# Patient Record
Sex: Male | Born: 1960 | Race: White | Hispanic: No | Marital: Single | Smoking: Never smoker
Health system: Southern US, Community
[De-identification: ages and names within clinical notes are randomized; demographics above are authoritative.]

## PROBLEM LIST (undated history)

## (undated) DIAGNOSIS — N179 Acute kidney failure, unspecified: Secondary | ICD-10-CM

## (undated) DIAGNOSIS — I82409 Acute embolism and thrombosis of unspecified deep veins of unspecified lower extremity: Secondary | ICD-10-CM

## (undated) DIAGNOSIS — E119 Type 2 diabetes mellitus without complications: Secondary | ICD-10-CM

## (undated) DIAGNOSIS — H3530 Unspecified macular degeneration: Secondary | ICD-10-CM

## (undated) HISTORY — PX: LEG AMPUTATION BELOW KNEE: SHX694

## (undated) HISTORY — PX: APPENDECTOMY: SHX54

---

## 2015-09-26 ENCOUNTER — Emergency Department (HOSPITAL_COMMUNITY)
Admission: EM | Admit: 2015-09-26 | Discharge: 2015-09-26 | Disposition: A | Discharge status: Home / Self Care | Payer: BLUE CROSS/BLUE SHIELD | Attending: Emergency Medicine | Admitting: Emergency Medicine

## 2015-09-26 ENCOUNTER — Emergency Department (HOSPITAL_COMMUNITY): Payer: BLUE CROSS/BLUE SHIELD

## 2015-09-26 ENCOUNTER — Encounter (HOSPITAL_COMMUNITY): Payer: Self-pay | Admitting: *Deleted

## 2015-09-26 DIAGNOSIS — W1839XA Other fall on same level, initial encounter: Secondary | ICD-10-CM | POA: Diagnosis not present

## 2015-09-26 DIAGNOSIS — Z79899 Other long term (current) drug therapy: Secondary | ICD-10-CM | POA: Diagnosis not present

## 2015-09-26 DIAGNOSIS — W19XXXA Unspecified fall, initial encounter: Secondary | ICD-10-CM

## 2015-09-26 DIAGNOSIS — Z8669 Personal history of other diseases of the nervous system and sense organs: Secondary | ICD-10-CM | POA: Insufficient documentation

## 2015-09-26 DIAGNOSIS — Z7901 Long term (current) use of anticoagulants: Secondary | ICD-10-CM | POA: Insufficient documentation

## 2015-09-26 DIAGNOSIS — Z043 Encounter for examination and observation following other accident: Secondary | ICD-10-CM | POA: Diagnosis present

## 2015-09-26 DIAGNOSIS — Z9889 Other specified postprocedural states: Secondary | ICD-10-CM | POA: Diagnosis not present

## 2015-09-26 DIAGNOSIS — Z86718 Personal history of other venous thrombosis and embolism: Secondary | ICD-10-CM | POA: Insufficient documentation

## 2015-09-26 DIAGNOSIS — Y9289 Other specified places as the place of occurrence of the external cause: Secondary | ICD-10-CM | POA: Insufficient documentation

## 2015-09-26 DIAGNOSIS — Z87448 Personal history of other diseases of urinary system: Secondary | ICD-10-CM | POA: Insufficient documentation

## 2015-09-26 DIAGNOSIS — E119 Type 2 diabetes mellitus without complications: Secondary | ICD-10-CM | POA: Diagnosis not present

## 2015-09-26 DIAGNOSIS — Y998 Other external cause status: Secondary | ICD-10-CM | POA: Diagnosis not present

## 2015-09-26 DIAGNOSIS — Y9389 Activity, other specified: Secondary | ICD-10-CM | POA: Insufficient documentation

## 2015-09-26 DIAGNOSIS — Z7982 Long term (current) use of aspirin: Secondary | ICD-10-CM | POA: Diagnosis not present

## 2015-09-26 HISTORY — DX: Unspecified macular degeneration: H35.30

## 2015-09-26 HISTORY — DX: Acute kidney failure, unspecified: N17.9

## 2015-09-26 HISTORY — DX: Acute embolism and thrombosis of unspecified deep veins of unspecified lower extremity: I82.409

## 2015-09-26 HISTORY — DX: Type 2 diabetes mellitus without complications: E11.9

## 2015-09-26 LAB — CBG MONITORING, ED: GLUCOSE-CAPILLARY: 110 mg/dL — AB (ref 65–99)

## 2015-09-26 MED ORDER — OXYCODONE-ACETAMINOPHEN 5-325 MG PO TABS
1.0000 | ORAL_TABLET | Freq: Once | ORAL | Status: AC
  Administered 2015-09-26: 1 via ORAL
  Filled 2015-09-26: qty 1

## 2015-09-26 NOTE — ED Notes (Signed)
Per EMS - patient [place] nearby restaurant with c/o left BKA wound dehiscence.  Patient was at a restaurant when he lost his balance and fell on amputation, which was done mid-September.  Patient's sutures were just removed yesterday.  Patient now presents with dehisced wound.  Patient's vitals WNL 127/83, HR 88, RR 20, CBG 270.

## 2015-09-26 NOTE — ED Notes (Signed)
Patient presents with left BKA and dehisced wound on stump.  Patient had amputation in September 2016 and sutures were removed yesterday.  Patient fell on stump earlier today and the wound dehisced.  Bleeding is controlled.

## 2015-09-26 NOTE — Discharge Instructions (Signed)
Call your surgeon tomorrow and see when they want to see you.  Change dressing daily

## 2015-09-26 NOTE — ED Notes (Signed)
Bed: [id number] Expected date:  Expected time:  Means of arrival:  Comments: [age] M fall, opened stitches back up from BKA

## 2015-09-26 NOTE — Progress Notes (Signed)
Patient listed as not having insurance or a pcp.  EDCM spoke to patient at bedside.  Patient reports he has [employer].  He reports that [facility] has assigned him a pcp, he has not met him yet.

## 2015-09-30 NOTE — ED Provider Notes (Signed)
CSN: [id number]     Arrival date & time 09/26/15  1531 History   First MD Initiated Contact with Patient 09/26/15 1533     Chief Complaint  Patient presents with  . Wound dehiscence      (Consider location/radiation/quality/duration/timing/severity/associated sxs/prior Treatment) Patient is a 54 y.o. male presenting with fall. The history is provided by the patient (Patient had a left BKA a month ago patient states his sutures were taken out yesterday. He fell today right on his left leg. The wound has opened up.).  Fall This is a new problem. The current episode started less than 1 hour ago. The problem occurs constantly. The problem has not changed since onset.Pertinent negatives include no chest pain, no abdominal pain and no headaches. Nothing aggravates the symptoms. Nothing relieves the symptoms.    Past Medical History  Diagnosis Date  . Diabetes mellitus without complication (HCC)   . Macular degeneration   . DVT (deep venous thrombosis) (HCC)   . Acute kidney injury [facility])    Past Surgical History  Procedure Laterality Date  . Leg amputation below knee Left   . Appendectomy     No family history on file. Social History  Substance Use Topics  . Smoking status: Never Smoker   . Smokeless tobacco: Never Used  . 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  HENT: Negative for congestion, ear discharge and sinus pressure.   Eyes: Negative for discharge.  Respiratory: Negative for cough.   Cardiovascular: Negative for chest pain.  Gastrointestinal: Negative for abdominal pain and diarrhea.  Genitourinary: Negative for frequency and hematuria.  Musculoskeletal: Negative for back pain.  Skin: Negative for rash.  Neurological: Negative for seizures and headaches.  Psychiatric/Behavioral: Negative for hallucinations.      Allergies  Review of patient's allergies  indicates no known allergies.  Home Medications   Prior to Admission medications   Medication Sig Start Date End Date Taking? Authorizing Provider  acetaminophen (TYLENOL) 325 MG tablet Take 975 mg by mouth every 6 (six) hours as needed for moderate pain.  09/24/15 10/24/15 Yes Historical Provider, MD  aspirin EC 81 MG tablet Take 81 mg by mouth daily.  09/24/15 10/24/15 Yes Historical Provider, MD  citalopram (CELEXA) 20 MG tablet Take 20 mg by mouth daily.  09/24/15  Yes Historical Provider, MD  glipiZIDE (GLUCOTROL XL) 2.5 MG 24 hr tablet Take 2.5 mg by mouth daily with breakfast.  09/24/15 10/24/15 Yes Historical Provider, MD  magnesium oxide (MAG-OX) 400 MG tablet Take 400 mg by mouth daily.  09/24/15 10/24/15 Yes Historical Provider, MD  oxyCODONE (OXY IR/ROXICODONE) 5 MG immediate release tablet Take 5 mg by mouth daily as needed for severe pain.  09/18/15  Yes Historical Provider, MD  polyvinyl alcohol (LIQUIFILM TEARS) 1.4 % ophthalmic solution Place 1 drop into both eyes daily as needed for dry eyes.  09/18/15 10/18/15 Yes Historical Provider, MD  warfarin (COUMADIN) 2 MG tablet Take 2 tablets (total ) daily at 6pm 09/24/15  Yes Historical Provider, MD  traMADol (ULTRAM) 50 MG tablet Take 50 mg by mouth every 6 (six) hours as needed for moderate pain or severe pain.  08/04/15   Historical Provider, MD   BP 146/87 mmHg  Pulse 93  Temp(Src) 99 F (37.2 C) (Oral)  Resp 16  SpO2 99% Physical Exam  Constitutional: He is oriented to person, place, and  time. He appears well-developed.  HENT:  Head: Normocephalic.  Eyes: Conjunctivae are normal.  Neck: No tracheal deviation present.  Cardiovascular:  No murmur heard. Musculoskeletal: Normal range of motion.  Patient has a left BKA with opening of wound from surgery.  Neurological: He is oriented to person, place, and time.  Skin: Skin is warm.  Psychiatric: He has a normal mood and affect.    ED Course  Procedures (including  critical care time) Labs Review Labs Reviewed  CBG MONITORING, ED - Abnormal; Notable for the following:    Glucose-Capillary 110 (*)    All other components within normal limits    Imaging Review No results found. I have personally reviewed and evaluated these images and lab results as part of my medical decision-making.   EKG Interpretation None      MDM   Final diagnoses:  Fall, initial encounter    X-ray unremarkable wound opening from fall on left BKA. Area dressed appropriately patient is to call his surgeon tomorrow for follow-up care    Bethann BerkshireJoseph Arvilla Salada, MD 09/30/15 [phone]

## 2016-03-01 IMAGING — CR DG KNEE COMPLETE 4+V*L*
4 series · 4 of 4 positions shown · non-contrast
Comparison: None.

CLINICAL DATA: Status post BKA in Saturday August, 2015, recent fall
with opening of the wound

EXAM:
LEFT KNEE - COMPLETE 4+ VIEW

[x knee ap left (1 of 4)]
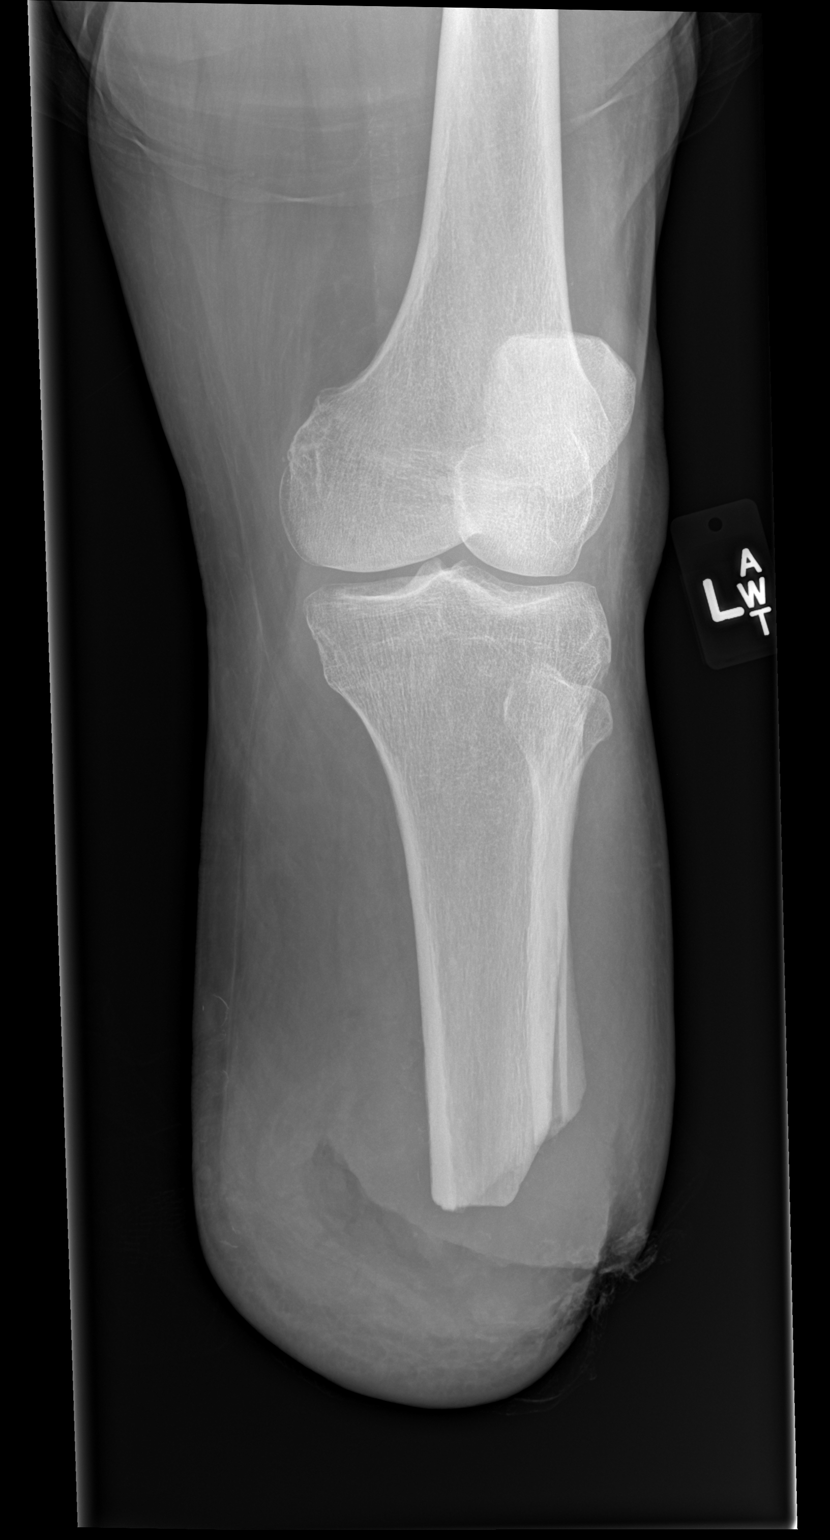

[x knee ap left (2 of 4)]
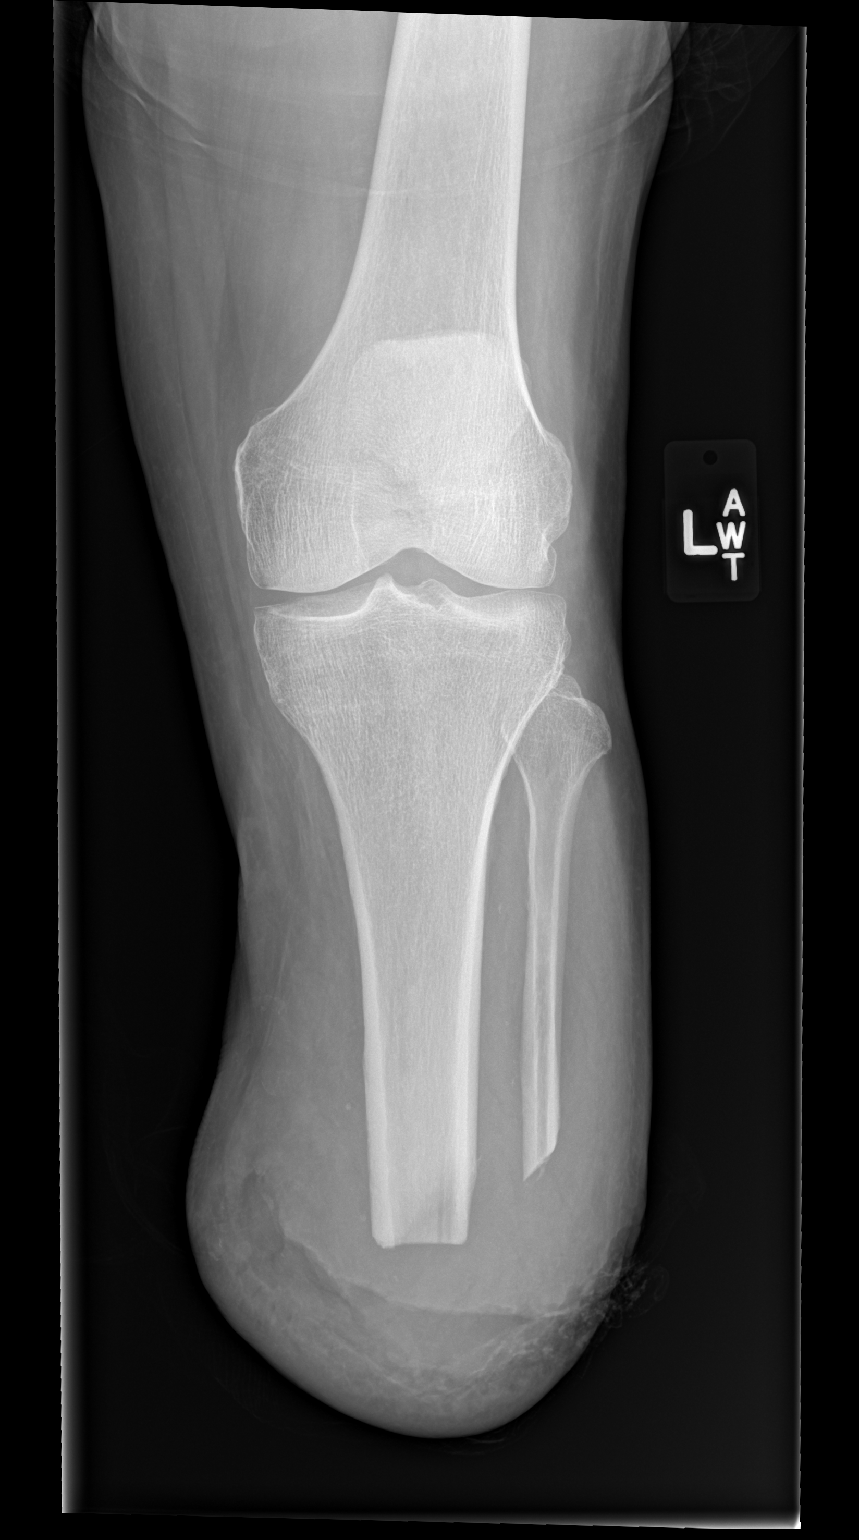

[x knee ap left (3 of 4)]
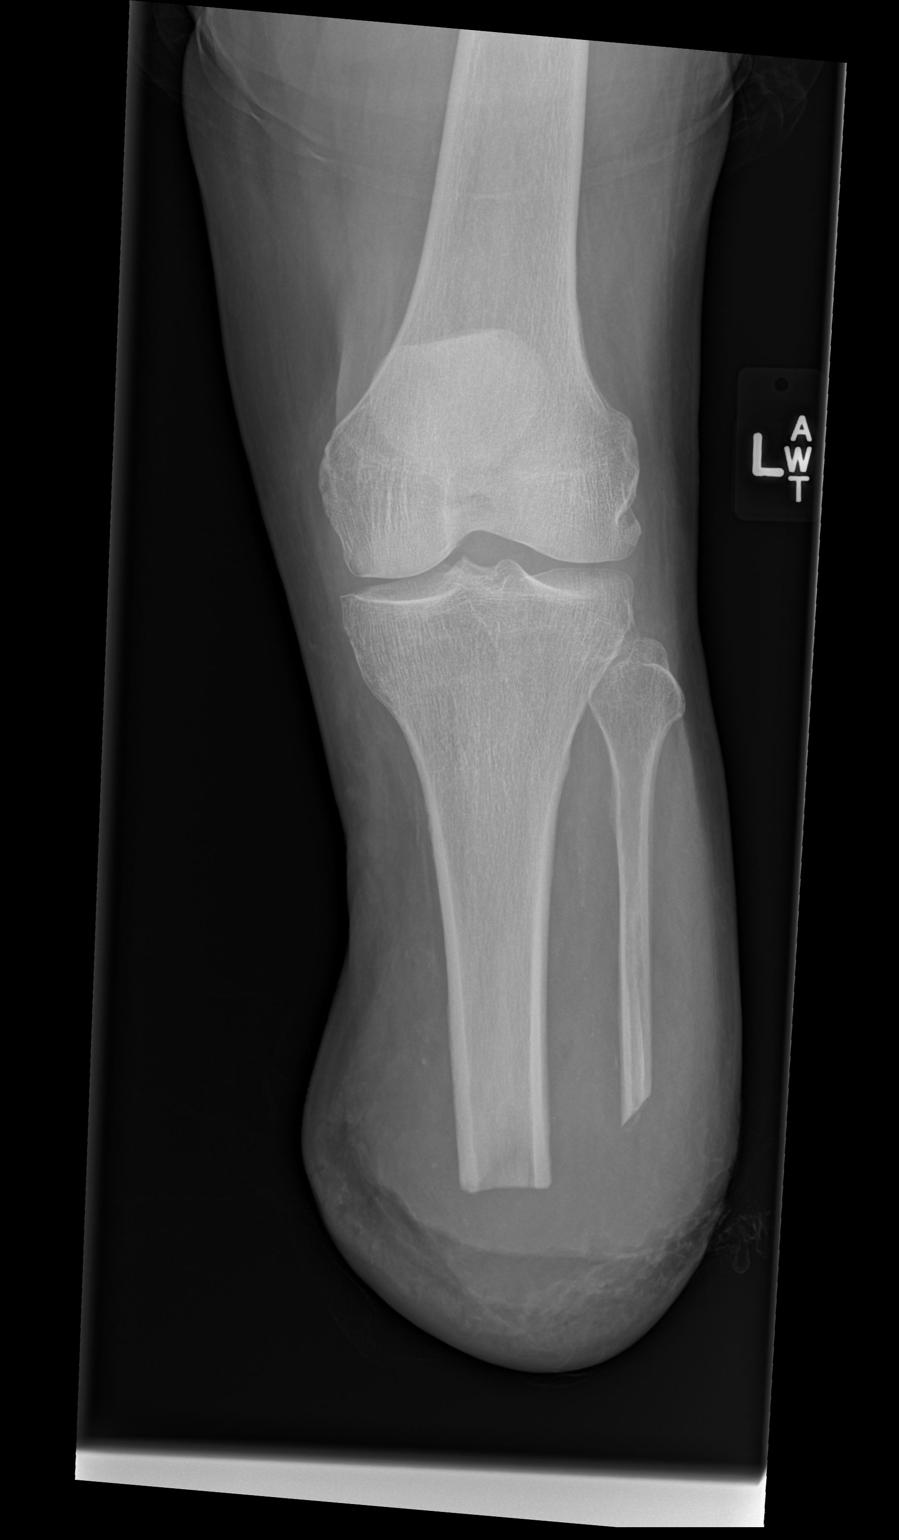

[x knee ap left (4 of 4)]
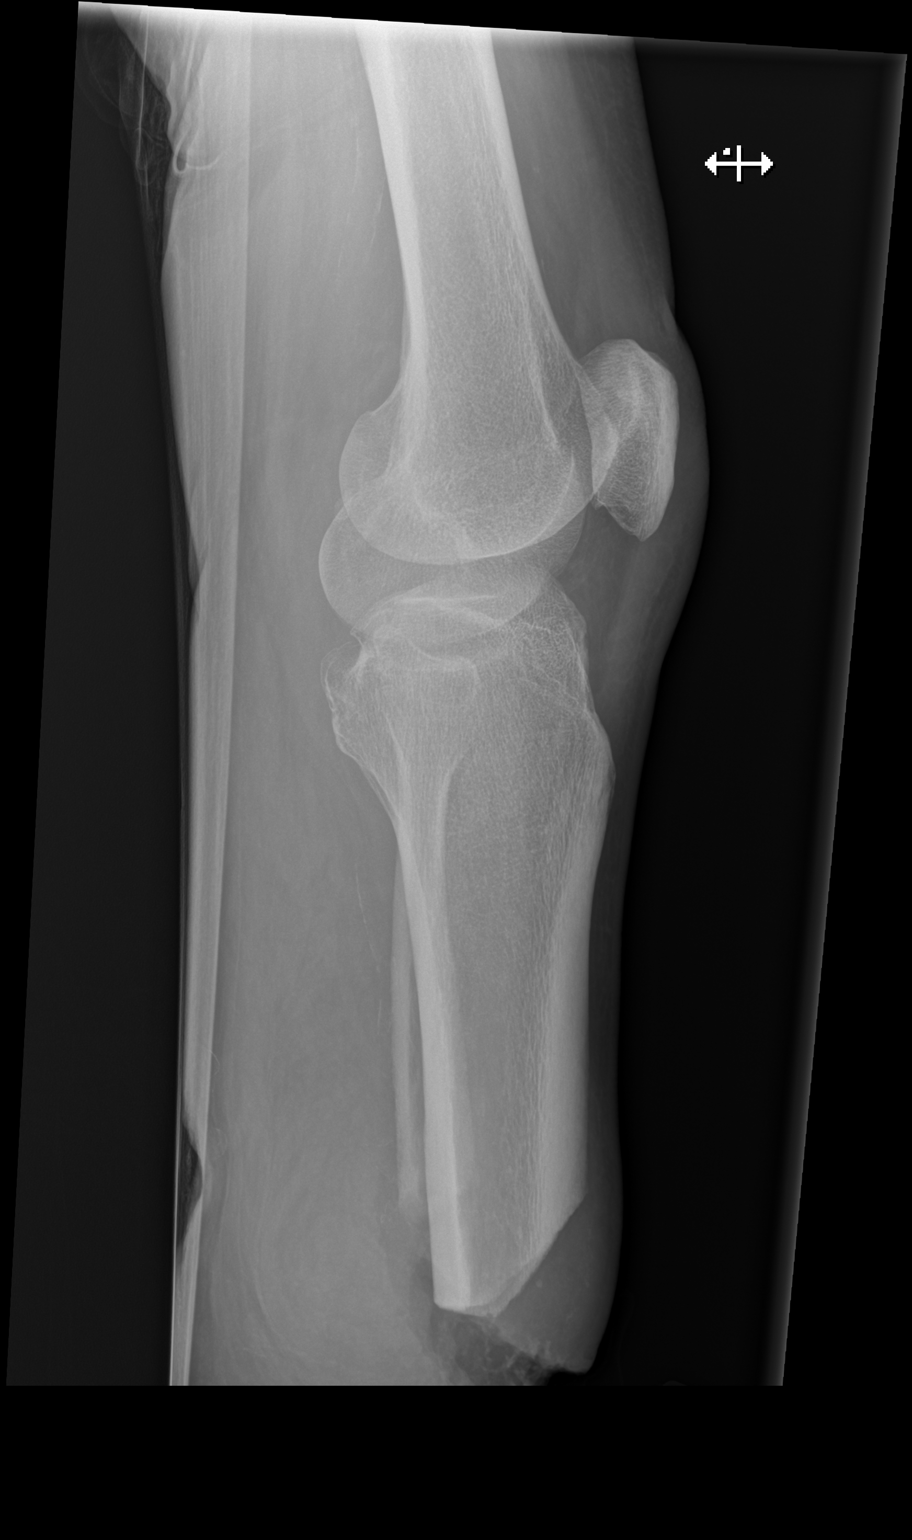

[4 of 4 positions shown; findings below may reference images not displayed]

FINDINGS: No acute bony abnormality is noted. The wound over the amputation
has opened consistent with the given clinical history. It appears to
extend close to the residual tibia although not to the bony margin.
No other focal abnormality is seen.
IMPRESSION: Opening of previous amputation wound secondary to recent injury. No
acute bony abnormality is seen.
